# Patient Record
Sex: Female | Born: 1970 | Race: Black or African American | Hispanic: No | Marital: Married | Smoking: Former smoker
Health system: Southern US, Community
[De-identification: ages and names within clinical notes are randomized; demographics above are authoritative.]

## PROBLEM LIST (undated history)

## (undated) DIAGNOSIS — I10 Essential (primary) hypertension: Secondary | ICD-10-CM

## (undated) HISTORY — PX: CHOLECYSTECTOMY: SHX55

## (undated) HISTORY — PX: THYROIDECTOMY: SHX17

## (undated) HISTORY — PX: THYROID SURGERY: SHX805

---

## 2012-06-22 ENCOUNTER — Ambulatory Visit: Payer: Self-pay | Admitting: Internal Medicine

## 2013-06-26 ENCOUNTER — Ambulatory Visit: Payer: Self-pay | Admitting: Internal Medicine

## 2014-07-02 ENCOUNTER — Ambulatory Visit: Payer: Self-pay | Admitting: Obstetrics and Gynecology

## 2014-09-02 ENCOUNTER — Ambulatory Visit: Payer: Self-pay | Admitting: Surgery

## 2014-09-02 LAB — BASIC METABOLIC PANEL
Anion Gap: 2 — ABNORMAL LOW (ref 7–16)
BUN: 12 mg/dL (ref 7–18)
CREATININE: 0.67 mg/dL (ref 0.60–1.30)
Calcium, Total: 8.4 mg/dL — ABNORMAL LOW (ref 8.5–10.1)
Chloride: 110 mmol/L — ABNORMAL HIGH (ref 98–107)
Co2: 27 mmol/L (ref 21–32)
EGFR (African American): >60
EGFR (Non-African Amer.): >60
GLUCOSE: 85 mg/dL (ref 65–99)
Osmolality: 277 (ref 275–301)
Potassium: 3.8 mmol/L (ref 3.5–5.1)
Sodium: 139 mmol/L (ref 136–145)

## 2014-09-02 LAB — HEPATIC FUNCTION PANEL A (ARMC)
ALK PHOS: 61 U/L
ALT: 15 U/L
Albumin: 3.3 g/dL — ABNORMAL LOW (ref 3.4–5.0)
Bilirubin, Direct: 0.1 mg/dL (ref 0.00–0.20)
Bilirubin,Total: 0.5 mg/dL (ref 0.2–1.0)
SGOT(AST): 19 U/L (ref 15–37)
Total Protein: 6.9 g/dL (ref 6.4–8.2)

## 2014-09-02 LAB — CBC WITH DIFFERENTIAL/PLATELET
BASOS PCT: 1.4 %
Basophil #: 0.1 10*3/uL (ref 0.0–0.1)
EOS ABS: 0.1 10*3/uL (ref 0.0–0.7)
Eosinophil %: 1.2 %
HCT: 40.1 % (ref 35.0–47.0)
HGB: 12.5 g/dL (ref 12.0–16.0)
Lymphocyte #: 2.3 10*3/uL (ref 1.0–3.6)
Lymphocyte %: 38.4 %
MCH: 28.4 pg (ref 26.0–34.0)
MCHC: 31.2 g/dL — ABNORMAL LOW (ref 32.0–36.0)
MCV: 91 fL (ref 80–100)
Monocyte #: 0.4 x10 3/mm (ref 0.2–0.9)
Monocyte %: 7.1 %
Neutrophil #: 3.1 10*3/uL (ref 1.4–6.5)
Neutrophil %: 51.9 %
PLATELETS: 294 10*3/uL (ref 150–440)
RBC: 4.4 10*6/uL (ref 3.80–5.20)
RDW: 13.4 % (ref 11.5–14.5)
WBC: 6 10*3/uL (ref 3.6–11.0)

## 2014-09-02 LAB — PROTIME-INR
INR: 1
Prothrombin Time: 13.5 secs (ref 11.5–14.7)

## 2014-09-02 LAB — APTT: ACTIVATED PTT: 27.3 s (ref 23.6–35.9)

## 2014-09-09 ENCOUNTER — Ambulatory Visit: Payer: Self-pay | Admitting: Surgery

## 2014-09-10 LAB — ALBUMIN: Albumin: 3.1 g/dL — ABNORMAL LOW (ref 3.4–5.0)

## 2014-09-10 LAB — CALCIUM: CALCIUM: 8.3 mg/dL — AB (ref 8.5–10.1)

## 2014-09-11 LAB — PATHOLOGY REPORT

## 2014-09-20 ENCOUNTER — Ambulatory Visit: Payer: Self-pay | Admitting: Surgery

## 2014-09-20 LAB — URINALYSIS, COMPLETE
Bacteria: NONE SEEN
Bilirubin,UR: NEGATIVE
Blood: NEGATIVE
Glucose,UR: NEGATIVE mg/dL (ref 0–75)
KETONE: NEGATIVE
Leukocyte Esterase: NEGATIVE
Nitrite: NEGATIVE
PH: 6 (ref 4.5–8.0)
Protein: NEGATIVE
RBC,UR: 1 /HPF (ref 0–5)
Specific Gravity: 1.024 (ref 1.003–1.030)
Squamous Epithelial: 2
WBC UR: <1 /HPF (ref 0–5)

## 2014-09-22 LAB — URINE CULTURE

## 2015-03-29 NOTE — Op Note (Signed)
PATIENT NAME:  Brenda Chapman, Brenda Chapman MR#:  [id number] DATE OF BIRTH:  1971-06-22  DATE OF PROCEDURE:  09/09/2014  OPERATION PERFORMED: Total thyroidectomy.   PREOPERATIVE DIAGNOSIS: Follicular neoplasm.   POSTOPERATIVE DIAGNOSIS: Follicular neoplasm.  SURGEON: Claude MangesWilliam Chapman Halston Fairclough, MD   FIRST ASSISTANT: Hulda Marinimothy Oaks, MD   ANESTHESIA: General.   PROCEDURE IN DETAIL: The patient was placed supine on the operating room table and prepped and draped in the usual sterile fashion. A curvilinear incision was made in the lines of the neck 2 fingerbreadths above the suprasternal notch, and this was carried down through the skin, subcutaneous tissue and platysma muscle with electrocautery. The subplatysmal flaps are created superiorly and inferiorly, and the intermediate fascia of the neck was opened in the midline between the strap muscles and the strap muscles were dissected off the anterior thyroid lobes bilaterally. On each side, the thyroid was rotated medially and neither side had a middle thyroid vein. On each side, the superior pole vessels were ligated and divided with the Harmonic scalpel. The inferior pole vessels were ligated and divided with the Harmonic scalpel. The superior parathyroid glands were identified and were spared, and the area of the inferior parathyroid glands was never dissected. The recurrent laryngeal nerve was identified and was spared throughout the procedure, taking care not to use any energy devices near it. Hemostasis near the nerve was achieved with a combination of bipolar electrocautery, 3-0 silk ligatures, small Hemoclips and on the left a single 6-0 Prolene figure-of-eight suture ligature. The thyroid attachments to the trachea were divided with the electrocautery allowing me to remove it completely. On the right side, the primary thyroid nodule was extremely close the recurrent laryngeal nerve along a distance of approximately 2 cm and very tedious dissection was undertaken in  order not to bother the nerve. Small pieces of topical thrombin-soaked Gelfoam were placed where the nerve pierced the cricothyroid muscle bilaterally, and both of these were removed prior to closure. A TLS drain was placed on both sides of the neck and brought out through the suprasternal notch and the intermediate fascia was closed in the midline with a running 3-0 Monocryl suture. Running 3-0 Monocryl was then used to close the platysma and the skin was reapproximated with a running subcuticular 5-0 Monocryl and suture strips. The patient tolerated the procedure well. There were no complications.    ____________________________ Claude MangesWilliam Chapman. Sabina Beavers, MD wfm:TT D: 09/09/2014 13:42:50 ET T: 09/09/2014 15:48:35 ET JOB#: [id number]  cc: Claude MangesWilliam Chapman. Mariam Helbert, MD, <Dictator> Claude MangesWILLIAM Chapman Roderica Cathell MD ELECTRONICALLY SIGNED 09/09/2014 16:28

## 2016-09-26 ENCOUNTER — Emergency Department: Payer: 59

## 2016-09-26 ENCOUNTER — Encounter: Payer: Self-pay | Admitting: Emergency Medicine

## 2016-09-26 ENCOUNTER — Emergency Department
Admission: EM | Admit: 2016-09-26 | Discharge: 2016-09-26 | Disposition: A | Discharge status: Home / Self Care | Payer: 59 | Attending: Student in an Organized Health Care Education/Training Program | Admitting: Student in an Organized Health Care Education/Training Program

## 2016-09-26 DIAGNOSIS — Z87891 Personal history of nicotine dependence: Secondary | ICD-10-CM | POA: Diagnosis not present

## 2016-09-26 DIAGNOSIS — Y9389 Activity, other specified: Secondary | ICD-10-CM | POA: Diagnosis not present

## 2016-09-26 DIAGNOSIS — R102 Pelvic and perineal pain: Secondary | ICD-10-CM | POA: Diagnosis not present

## 2016-09-26 DIAGNOSIS — R1030 Lower abdominal pain, unspecified: Secondary | ICD-10-CM | POA: Diagnosis not present

## 2016-09-26 DIAGNOSIS — Y92410 Unspecified street and highway as the place of occurrence of the external cause: Secondary | ICD-10-CM | POA: Diagnosis not present

## 2016-09-26 DIAGNOSIS — Y999 Unspecified external cause status: Secondary | ICD-10-CM | POA: Diagnosis not present

## 2016-09-26 DIAGNOSIS — S93121A Dislocation of metatarsophalangeal joint of right great toe, initial encounter: Secondary | ICD-10-CM | POA: Diagnosis not present

## 2016-09-26 DIAGNOSIS — S99921A Unspecified injury of right foot, initial encounter: Secondary | ICD-10-CM | POA: Diagnosis present

## 2016-09-26 DIAGNOSIS — S93104A Unspecified dislocation of right toe(s), initial encounter: Secondary | ICD-10-CM

## 2016-09-26 DIAGNOSIS — R0789 Other chest pain: Secondary | ICD-10-CM

## 2016-09-26 LAB — CBC WITH DIFFERENTIAL/PLATELET
BASOS ABS: 0.1 10*3/uL (ref 0–0.1)
BASOS PCT: 1 %
Eosinophils Absolute: 0.1 10*3/uL (ref 0–0.7)
Eosinophils Relative: 0 %
HEMATOCRIT: 40.8 % (ref 35.0–47.0)
HEMOGLOBIN: 13.4 g/dL (ref 12.0–16.0)
LYMPHS ABS: 2 10*3/uL (ref 1.0–3.6)
LYMPHS PCT: 12 %
MCH: 29.4 pg (ref 26.0–34.0)
MCHC: 32.7 g/dL (ref 32.0–36.0)
MCV: 89.9 fL (ref 80.0–100.0)
MONOS PCT: 6 %
Monocytes Absolute: 1 10*3/uL — ABNORMAL HIGH (ref 0.2–0.9)
Neutro Abs: 13.5 10*3/uL — ABNORMAL HIGH (ref 1.4–6.5)
Neutrophils Relative %: 81 %
Platelets: 311 10*3/uL (ref 150–440)
RBC: 4.54 MIL/uL (ref 3.80–5.20)
RDW: 13.3 % (ref 11.5–14.5)
WBC: 16.6 10*3/uL — ABNORMAL HIGH (ref 3.6–11.0)

## 2016-09-26 LAB — COMPREHENSIVE METABOLIC PANEL
ALBUMIN: 4 g/dL (ref 3.5–5.0)
ALK PHOS: 57 U/L (ref 38–126)
ALT: 18 U/L (ref 14–54)
AST: 29 U/L (ref 15–41)
Anion gap: 6 (ref 5–15)
BILIRUBIN TOTAL: 0.9 mg/dL (ref 0.3–1.2)
BUN: 11 mg/dL (ref 6–20)
CALCIUM: 9 mg/dL (ref 8.9–10.3)
CO2: 27 mmol/L (ref 22–32)
Chloride: 106 mmol/L (ref 101–111)
Creatinine, Ser: 0.7 mg/dL (ref 0.44–1.00)
GFR calc Af Amer: >60 mL/min (ref >60)
GFR calc non Af Amer: >60 mL/min (ref >60)
GLUCOSE: 97 mg/dL (ref 65–99)
Potassium: 3.5 mmol/L (ref 3.5–5.1)
Sodium: 139 mmol/L (ref 135–145)
TOTAL PROTEIN: 7.9 g/dL (ref 6.5–8.1)

## 2016-09-26 LAB — HCG, QUANTITATIVE, PREGNANCY: hCG, Beta Chain, Quant, S: <1 m[IU]/mL (ref <5)

## 2016-09-26 MED ORDER — ACETAMINOPHEN 325 MG PO TABS
650.0000 mg | ORAL_TABLET | Freq: Once | ORAL | Status: AC
  Administered 2016-09-26: 650 mg via ORAL
  Filled 2016-09-26: qty 2

## 2016-09-26 MED ORDER — ONDANSETRON HCL 4 MG/2ML IJ SOLN
4.0000 mg | Freq: Once | INTRAMUSCULAR | Status: AC
  Administered 2016-09-26: 4 mg via INTRAVENOUS
  Filled 2016-09-26: qty 2

## 2016-09-26 MED ORDER — NAPROXEN 500 MG PO TABS: 500.0000 mg | ORAL_TABLET | Freq: Two times a day (BID) | ORAL | 0 refills | Status: AC

## 2016-09-26 MED ORDER — BUPIVACAINE HCL (PF) 0.5 % IJ SOLN
30.0000 mL | Freq: Once | INTRAMUSCULAR | Status: DC
  Filled 2016-09-26: qty 30

## 2016-09-26 MED ORDER — IOPAMIDOL (ISOVUE-300) INJECTION 61%
125.0000 mL | Freq: Once | INTRAVENOUS | Status: AC | PRN
  Administered 2016-09-26: 125 mL via INTRAVENOUS

## 2016-09-26 MED ORDER — HYDROCODONE-ACETAMINOPHEN 5-325 MG PO TABS: 1.0000 | ORAL_TABLET | ORAL | 0 refills | Status: AC | PRN

## 2016-09-26 MED ORDER — FENTANYL CITRATE (PF) 100 MCG/2ML IJ SOLN
100.0000 ug | INTRAMUSCULAR | Status: DC | PRN
  Administered 2016-09-26: 100 ug via INTRAVENOUS
  Filled 2016-09-26: qty 2

## 2016-09-26 MED ORDER — CYCLOBENZAPRINE HCL 10 MG PO TABS: 10.0000 mg | ORAL_TABLET | Freq: Three times a day (TID) | ORAL | 0 refills | Status: AC | PRN
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
## 2017-06-22 ENCOUNTER — Other Ambulatory Visit: Payer: Self-pay | Admitting: Internal Medicine

## 2017-06-22 DIAGNOSIS — R600 Localized edema: Secondary | ICD-10-CM | POA: Diagnosis not present

## 2017-06-22 DIAGNOSIS — Z1231 Encounter for screening mammogram for malignant neoplasm of breast: Secondary | ICD-10-CM

## 2017-06-22 DIAGNOSIS — Z23 Encounter for immunization: Secondary | ICD-10-CM | POA: Diagnosis not present

## 2017-06-22 DIAGNOSIS — E890 Postprocedural hypothyroidism: Secondary | ICD-10-CM | POA: Diagnosis not present

## 2017-06-22 DIAGNOSIS — Z0000 Encounter for general adult medical examination without abnormal findings: Secondary | ICD-10-CM | POA: Diagnosis not present

## 2017-06-22 DIAGNOSIS — I10 Essential (primary) hypertension: Secondary | ICD-10-CM | POA: Diagnosis not present

## 2017-07-07 ENCOUNTER — Ambulatory Visit
Admission: RE | Admit: 2017-07-07 | Discharge: 2017-07-07 | Disposition: A | Discharge status: Home / Self Care | Payer: 59 | Source: Ambulatory Visit | Attending: Internal Medicine | Admitting: Internal Medicine

## 2017-07-07 DIAGNOSIS — Z1231 Encounter for screening mammogram for malignant neoplasm of breast: Secondary | ICD-10-CM

## 2017-08-06 ENCOUNTER — Emergency Department: Payer: Commercial Managed Care - HMO

## 2017-08-06 ENCOUNTER — Emergency Department
Admission: EM | Admit: 2017-08-06 | Discharge: 2017-08-06 | Disposition: A | Discharge status: Home / Self Care | Payer: Commercial Managed Care - HMO | Attending: Emergency Medicine | Admitting: Emergency Medicine

## 2017-08-06 ENCOUNTER — Encounter: Payer: Self-pay | Admitting: Emergency Medicine

## 2017-08-06 DIAGNOSIS — R0789 Other chest pain: Secondary | ICD-10-CM | POA: Diagnosis not present

## 2017-08-06 DIAGNOSIS — Z87891 Personal history of nicotine dependence: Secondary | ICD-10-CM | POA: Insufficient documentation

## 2017-08-06 DIAGNOSIS — Z79899 Other long term (current) drug therapy: Secondary | ICD-10-CM | POA: Diagnosis not present

## 2017-08-06 DIAGNOSIS — R079 Chest pain, unspecified: Secondary | ICD-10-CM | POA: Insufficient documentation

## 2017-08-06 HISTORY — DX: Essential (primary) hypertension: I10

## 2017-08-06 LAB — BASIC METABOLIC PANEL
Anion gap: 7 (ref 5–15)
BUN: 12 mg/dL (ref 6–20)
CO2: 27 mmol/L (ref 22–32)
CREATININE: 0.66 mg/dL (ref 0.44–1.00)
Calcium: 8.4 mg/dL — ABNORMAL LOW (ref 8.9–10.3)
Chloride: 104 mmol/L (ref 101–111)
GFR calc non Af Amer: >60 mL/min (ref >60)
Glucose, Bld: 90 mg/dL (ref 65–99)
Potassium: 3.1 mmol/L — ABNORMAL LOW (ref 3.5–5.1)
SODIUM: 138 mmol/L (ref 135–145)

## 2017-08-06 LAB — CBC
HCT: 35.5 % (ref 35.0–47.0)
Hemoglobin: 11.9 g/dL — ABNORMAL LOW (ref 12.0–16.0)
MCH: 29 pg (ref 26.0–34.0)
MCHC: 33.5 g/dL (ref 32.0–36.0)
MCV: 86.7 fL (ref 80.0–100.0)
Platelets: 308 10*3/uL (ref 150–440)
RBC: 4.09 MIL/uL (ref 3.80–5.20)
RDW: 13.4 % (ref 11.5–14.5)
WBC: 9.2 10*3/uL (ref 3.6–11.0)

## 2017-08-06 LAB — TROPONIN I: Troponin I: <0.03 ng/mL (ref <0.03)

## 2017-08-06 LAB — POCT PREGNANCY, URINE: PREG TEST UR: NEGATIVE

## 2017-08-06 MED ORDER — ONDANSETRON 4 MG PO TBDP
ORAL_TABLET | ORAL | Status: AC
  Filled 2017-08-06: qty 1

## 2017-08-06 MED ORDER — IBUPROFEN 600 MG PO TABS: 600.0000 mg | ORAL_TABLET | Freq: Three times a day (TID) | ORAL | 0 refills | Status: AC | PRN

## 2017-08-06 MED ORDER — ONDANSETRON 4 MG PO TBDP
4.0000 mg | ORAL_TABLET | Freq: Once | ORAL | Status: AC
  Administered 2017-08-06: 4 mg via ORAL

## 2017-08-06 MED ORDER — OXYCODONE-ACETAMINOPHEN 5-325 MG PO TABS
2.0000 | ORAL_TABLET | Freq: Once | ORAL | Status: AC
  Administered 2017-08-06: 2 via ORAL
  Filled 2017-08-06: qty 2

## 2017-08-06 MED ORDER — CYCLOBENZAPRINE HCL 10 MG PO TABS: 10.0000 mg | ORAL_TABLET | Freq: Three times a day (TID) | ORAL | 0 refills | Status: AC | PRN

## 2017-08-06 NOTE — ED Provider Notes (Signed)
[facility] Emergency Department Provider Note       Time seen: ----------------------------------------- 10:25 AM on 08/06/2017 -----------------------------------------     I have reviewed the triage vital signs and the nursing notes.   HISTORY   Chief Complaint Chest Pain    HPI Brenda Chapman is a 46 y.o. female who presents to the ED for chest pain that started last night. Patient is now having chest discomfort today. Pain is 7 out of 10 located in the mid to right chest, described as an ache. She has never had this happen before, nothing makes it better or worse.   Past Medical History:  Diagnosis Date  . Hypertension     There are no active problems to display for this patient.   Past Surgical History:  Procedure Laterality Date  . CHOLECYSTECTOMY    . THYROID SURGERY    . THYROIDECTOMY      Allergies Erythromycin  Social History Social History  Substance Use Topics  . Smoking status: Former Games developer  . Smokeless tobacco: Never Used  . Alcohol use Yes    Review of Systems Constitutional: Negative for fever. Cardiovascular: Positive for chest pain Respiratory: Negative for shortness of breath. Gastrointestinal: Negative for abdominal pain, vomiting and diarrhea. Genitourinary: Negative for dysuria. Musculoskeletal: Negative for back pain. Skin: Negative for rash. Neurological: Negative for headaches, focal weakness or numbness.  All systems negative/normal/unremarkable except as stated in the HPI  ____________________________________________   PHYSICAL EXAM:  VITAL SIGNS: ED Triage Vitals  Enc Vitals Group     BP 08/06/17 1018 136/87     Pulse Rate 08/06/17 1018 74     Resp 08/06/17 1018 18     Temp 08/06/17 1018 98.6 F (37 C)     Temp Source 08/06/17 1018 Oral     SpO2 08/06/17 1018 98 %     Weight 08/06/17 1018 222 lb 4.8 oz (100.8 kg)     Height 08/06/17 1018 5\' 3"  (1.6 m)     Head Circumference --       Peak Flow --      Pain Score 08/06/17 1016 7     Pain Loc --      Pain Edu? --      Excl. in GC? --     Constitutional: Alert and oriented. Well appearing and in no distress. Eyes: Conjunctivae are normal. Normal extraocular movements. ENT   Head: Normocephalic and atraumatic.   Nose: No congestion/rhinnorhea.   Mouth/Throat: Mucous membranes are moist.   Neck: No stridor. Cardiovascular: Normal rate, regular rhythm. No murmurs, rubs, or gallops. Respiratory: Normal respiratory effort without tachypnea nor retractions. Breath sounds are clear and equal bilaterally. No wheezes/rales/rhonchi. Gastrointestinal: Soft and nontender. Normal bowel sounds Musculoskeletal: pain with range of motion of the right shoulder and right upper chest wall tenderness Neurologic:  Normal speech and language. No gross focal neurologic deficits are appreciated.  Skin:  Skin is warm, dry and intact. No rash noted. Psychiatric: Mood and affect are normal. Speech and behavior are normal.  ____________________________________________  EKG: Interpreted by me.sinus rhythm rate of 60 bpm, normal PR interval, normal QRS, normal QT. Nonspecific T-wave changes.  ____________________________________________  ED COURSE:  Pertinent labs & imaging results that were available during my care of the patient were reviewed by me and considered in my medical decision making (see chart for details). Patient presents for chest pain, we will assess with labs and imaging as indicated.   Procedures ____________________________________________  LABS (pertinent positives/negatives)  Labs Reviewed  BASIC METABOLIC PANEL - Abnormal; Notable for the following:       Result Value   Potassium 3.1 (*)    Calcium 8.4 (*)    All other components within normal limits  CBC - Abnormal; Notable for the following:    Hemoglobin 11.9 (*)    All other components within normal limits  TROPONIN I  POC URINE PREG, ED   POCT PREGNANCY, URINE    RADIOLOGY Images were viewed by me  Chest x-ray  IMPRESSION: No acute cardiopulmonary disease. Stable scarring in the left lower lobe. ____________________________________________  FINAL ASSESSMENT AND PLAN  Chest pain  Plan: Patient's labs and imaging were dictated above. Patient had presented for chest pain and right upper chest and shoulder pain that appears to be musculoskeletal in origin.There is certainly pain and reproducible tenderness on examination. Heart score is low risk for ACS and she'll be discharged with NSAIDs and Flexeril.   Emily FilbertWilliams, Jonathan E, MD   Note: This note was generated in part or whole with voice recognition software. Voice recognition is usually quite accurate but there are transcription errors that can and very often do occur. I apologize for any typographical errors that were not detected and corrected.     Emily FilbertWilliams, Jonathan E, MD 08/06/17 1150

## 2017-08-06 NOTE — ED Triage Notes (Signed)
Patient to ER for c/o chest pain that started last night. Now having "chest discomfort" today. Patient ambulatory to triage without difficulty.

## 2017-08-10 DIAGNOSIS — I10 Essential (primary) hypertension: Secondary | ICD-10-CM | POA: Diagnosis not present

## 2017-08-10 DIAGNOSIS — E890 Postprocedural hypothyroidism: Secondary | ICD-10-CM | POA: Diagnosis not present

## 2017-08-10 DIAGNOSIS — M940 Chondrocostal junction syndrome [Tietze]: Secondary | ICD-10-CM | POA: Diagnosis not present

## 2017-08-23 DIAGNOSIS — T502X5A Adverse effect of carbonic-anhydrase inhibitors, benzothiadiazides and other diuretics, initial encounter: Secondary | ICD-10-CM | POA: Diagnosis not present

## 2017-08-23 DIAGNOSIS — E876 Hypokalemia: Secondary | ICD-10-CM | POA: Diagnosis not present

## 2017-08-23 DIAGNOSIS — Z23 Encounter for immunization: Secondary | ICD-10-CM | POA: Diagnosis not present

## 2017-08-23 DIAGNOSIS — E890 Postprocedural hypothyroidism: Secondary | ICD-10-CM | POA: Diagnosis not present

## 2017-08-23 DIAGNOSIS — I10 Essential (primary) hypertension: Secondary | ICD-10-CM | POA: Diagnosis not present

## 2017-09-20 DIAGNOSIS — H1012 Acute atopic conjunctivitis, left eye: Secondary | ICD-10-CM | POA: Diagnosis not present

## 2017-10-26 DIAGNOSIS — E890 Postprocedural hypothyroidism: Secondary | ICD-10-CM | POA: Diagnosis not present

## 2017-12-14 DIAGNOSIS — Z719 Counseling, unspecified: Secondary | ICD-10-CM | POA: Diagnosis not present

## 2017-12-21 DIAGNOSIS — Z719 Counseling, unspecified: Secondary | ICD-10-CM | POA: Diagnosis not present

## 2017-12-28 DIAGNOSIS — Z719 Counseling, unspecified: Secondary | ICD-10-CM | POA: Diagnosis not present

## 2018-01-04 DIAGNOSIS — Z719 Counseling, unspecified: Secondary | ICD-10-CM | POA: Diagnosis not present

## 2018-01-11 DIAGNOSIS — Z719 Counseling, unspecified: Secondary | ICD-10-CM | POA: Diagnosis not present

## 2018-02-01 IMAGING — CR DG CHEST 2V
2 series · 2 of 2 positions shown · non-contrast
Comparison: CT chest 09/26/2016.

CLINICAL DATA: Acute onset of chest pain that began last night.
Current history of hypertension. Former smoker.

EXAM:
CHEST  2 VIEW

[chest pa]
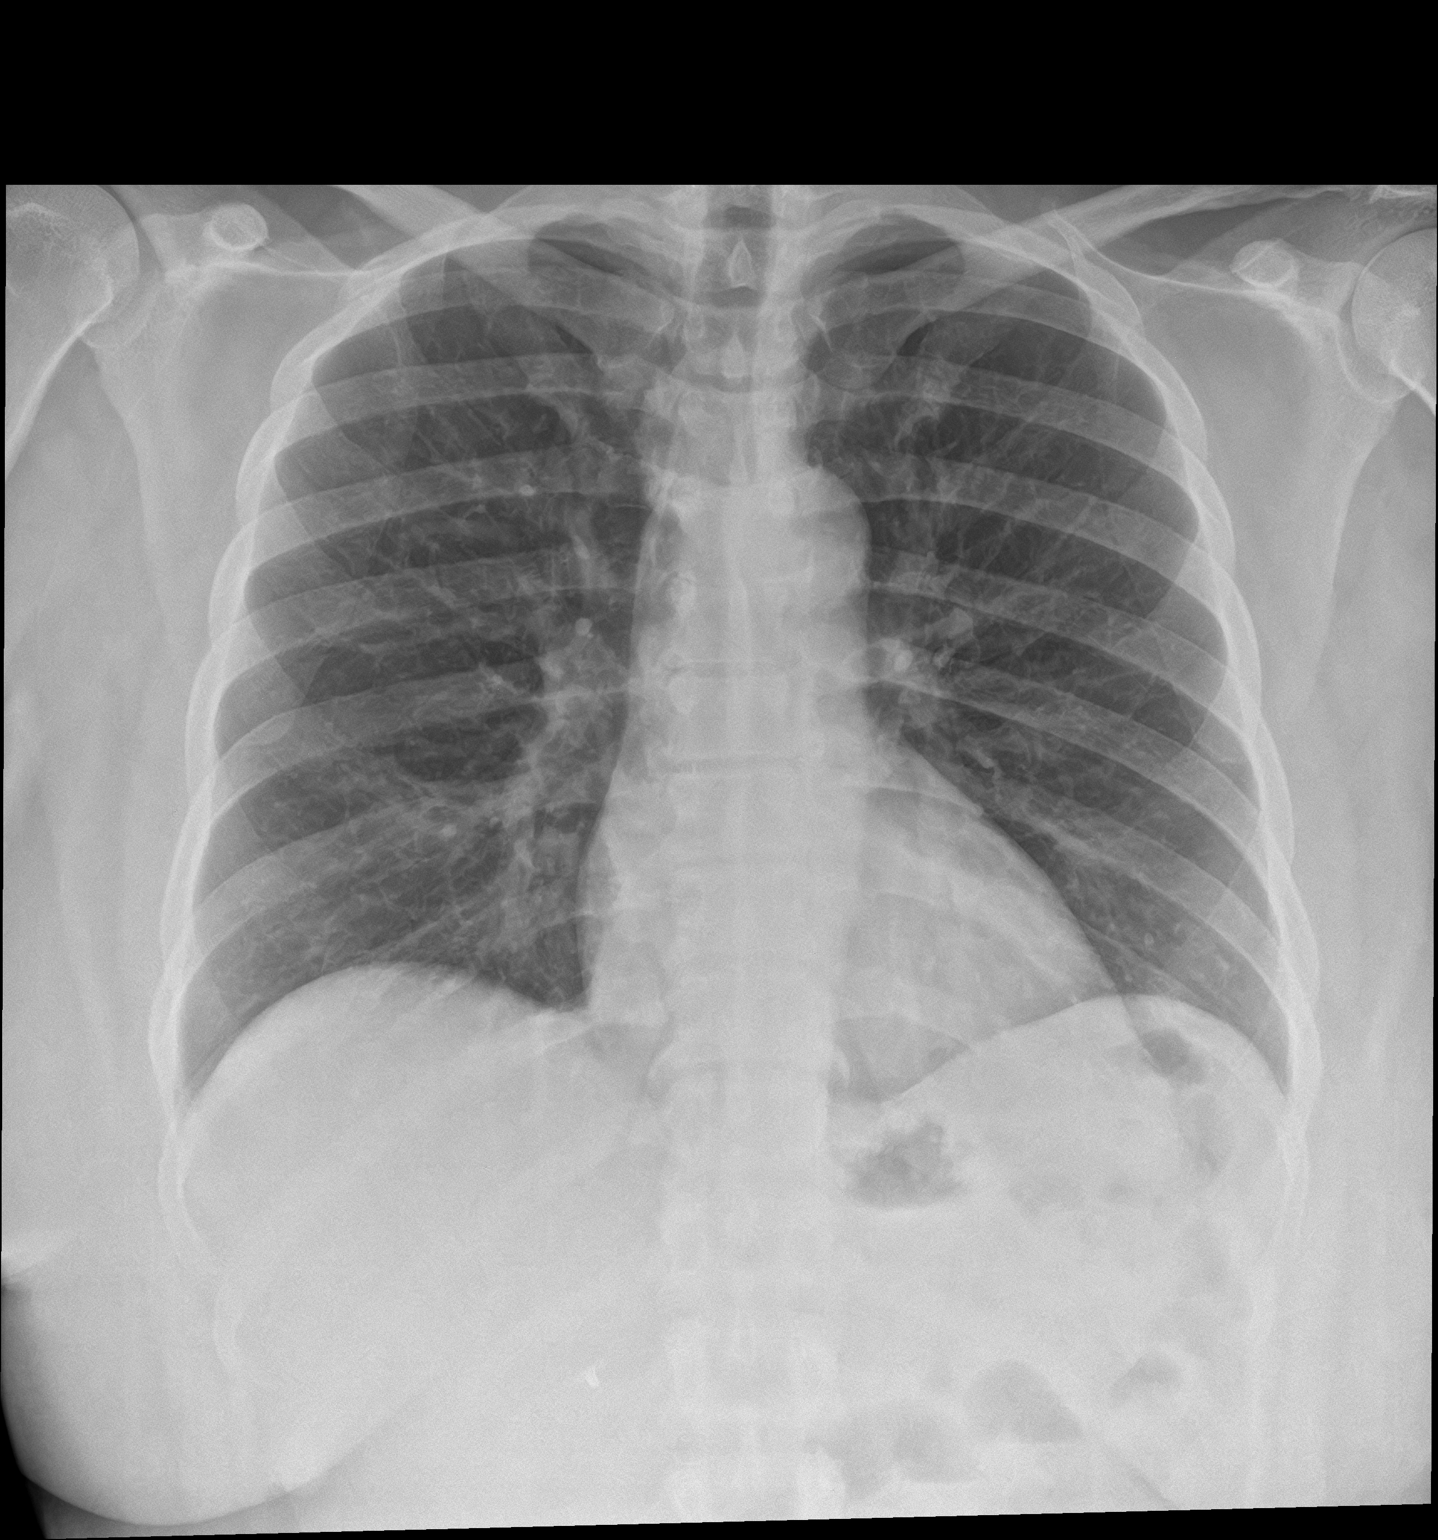

[chest lat]
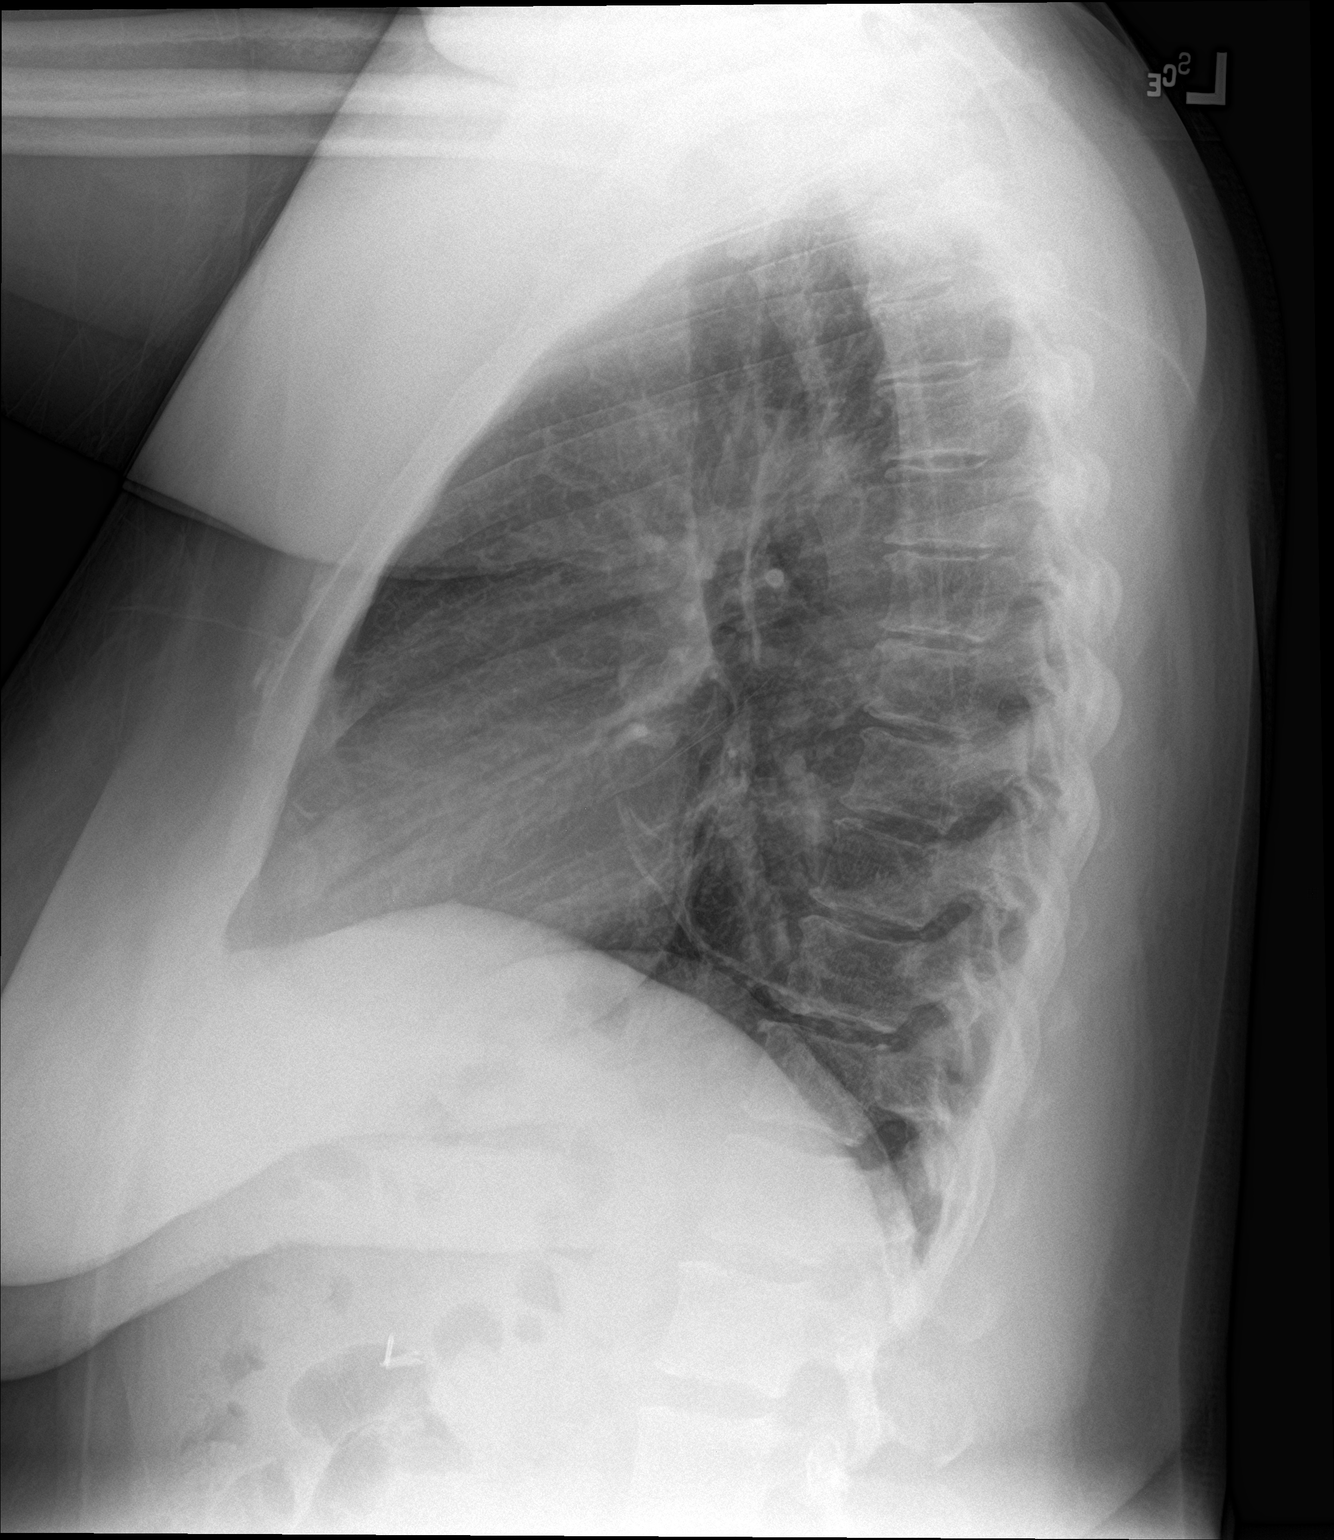

[2 of 2 positions shown; findings below may reference images not displayed]

FINDINGS: Cardiomediastinal silhouette unremarkable, unchanged. Linear
scarring in the left lower lobe, unchanged since the prior CT. Lungs
otherwise clear. Bronchovascular markings normal. No localized
airspace consolidation. No pleural effusions. No pneumothorax.
Normal pulmonary vascularity. Mild degenerative changes involving
the thoracic spine.
IMPRESSION: No acute cardiopulmonary disease. Stable scarring in the left lower
lobe.

## 2018-06-22 DIAGNOSIS — E890 Postprocedural hypothyroidism: Secondary | ICD-10-CM | POA: Diagnosis not present

## 2018-06-23 DIAGNOSIS — E890 Postprocedural hypothyroidism: Secondary | ICD-10-CM | POA: Diagnosis not present

## 2018-06-26 DIAGNOSIS — Z0000 Encounter for general adult medical examination without abnormal findings: Secondary | ICD-10-CM | POA: Diagnosis not present

## 2018-07-06 ENCOUNTER — Other Ambulatory Visit: Payer: Self-pay | Admitting: Internal Medicine

## 2018-07-06 DIAGNOSIS — Z1231 Encounter for screening mammogram for malignant neoplasm of breast: Secondary | ICD-10-CM

## 2018-07-18 ENCOUNTER — Ambulatory Visit: Payer: 59

## 2018-07-27 ENCOUNTER — Encounter (INDEPENDENT_AMBULATORY_CARE_PROVIDER_SITE_OTHER): Payer: Self-pay

## 2018-07-27 ENCOUNTER — Ambulatory Visit
Admission: RE | Admit: 2018-07-27 | Discharge: 2018-07-27 | Disposition: A | Discharge status: Home / Self Care | Payer: 59 | Source: Ambulatory Visit | Attending: Internal Medicine | Admitting: Internal Medicine

## 2018-07-27 DIAGNOSIS — Z1231 Encounter for screening mammogram for malignant neoplasm of breast: Secondary | ICD-10-CM | POA: Diagnosis present

## 2018-09-17 DIAGNOSIS — Z23 Encounter for immunization: Secondary | ICD-10-CM | POA: Diagnosis not present

## 2019-01-22 IMAGING — MG MM DIGITAL SCREENING BILAT W/ TOMO W/ CAD
8 series · 8 of 24 positions shown · non-contrast
Comparison: Previous exam(s).

CLINICAL DATA: Screening.

EXAM:
DIGITAL SCREENING BILATERAL MAMMOGRAM WITH TOMO AND CAD

[L CC synth-2D]
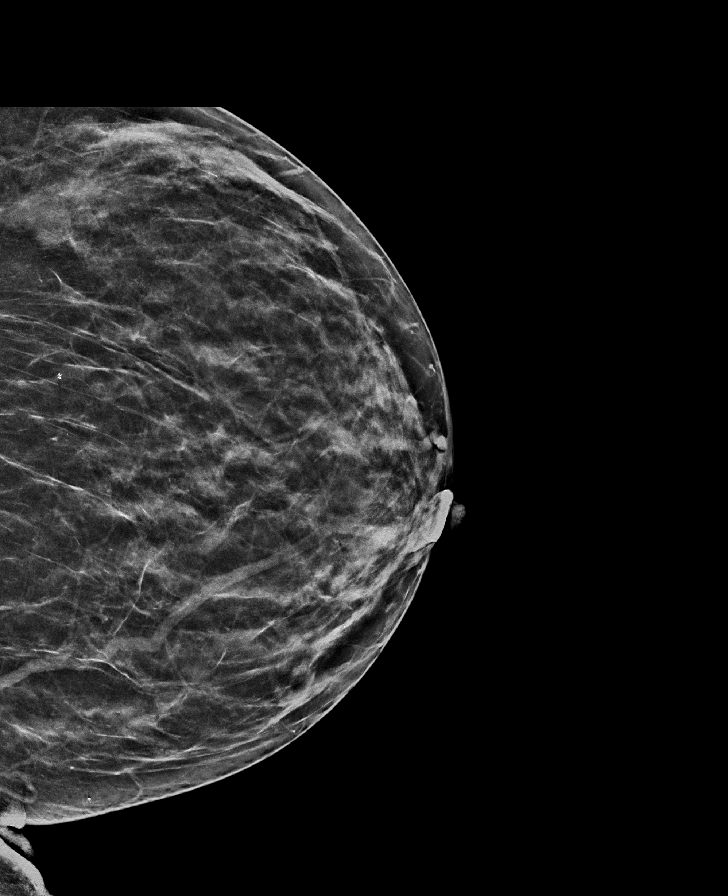

[R MLO synth-2D]
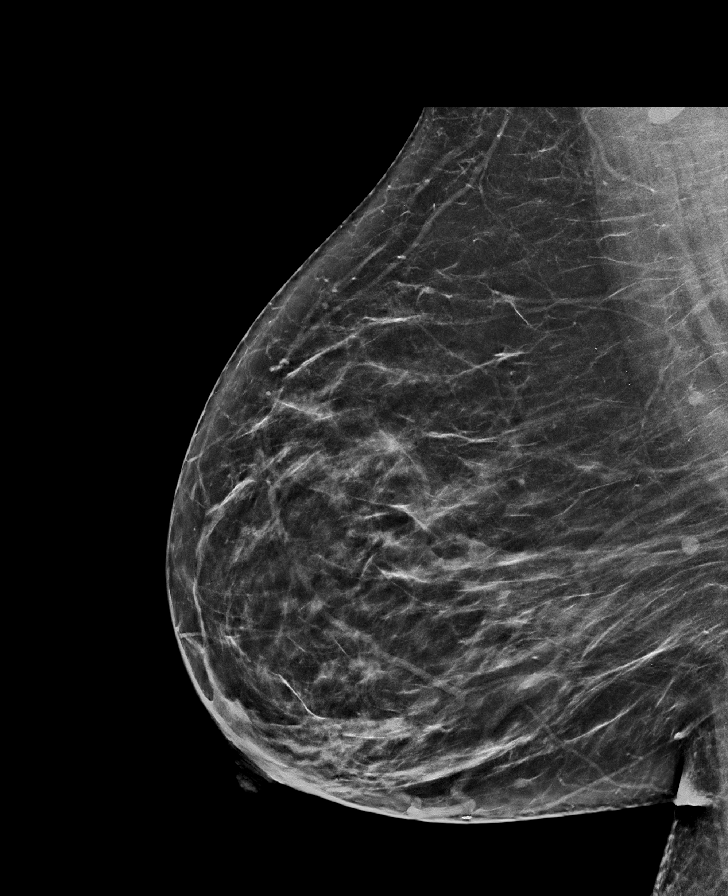

[L MLO synth-2D]
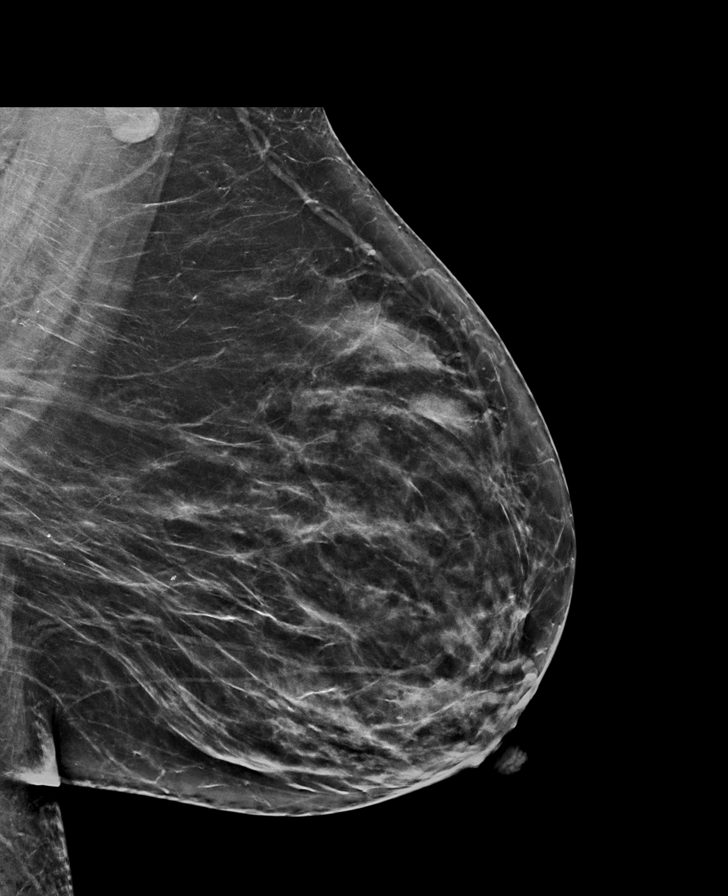

[R CC synth-2D]
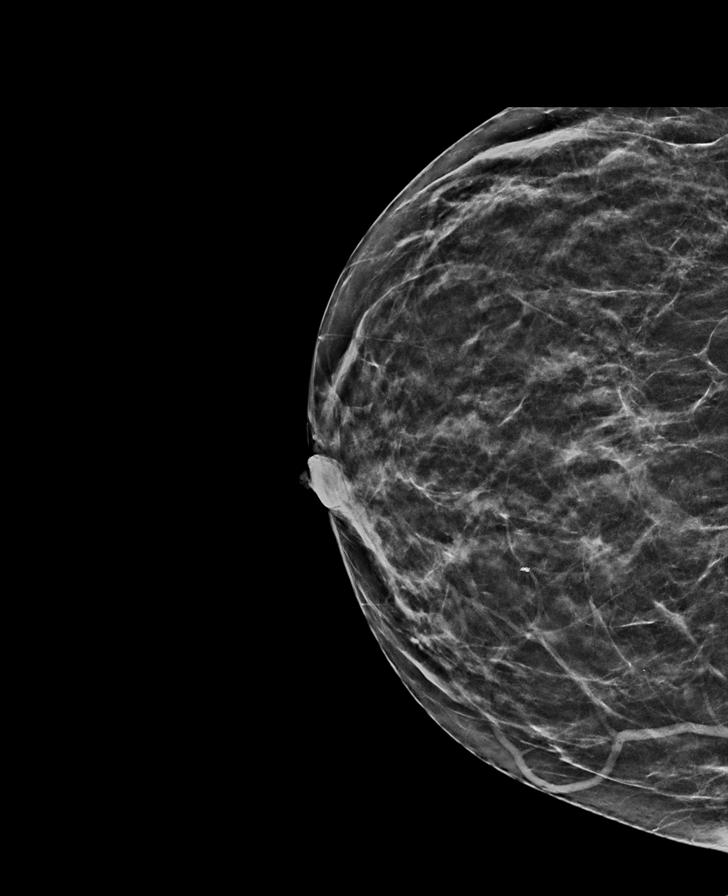

[L CC tomo · tomo slice 30/59.0]
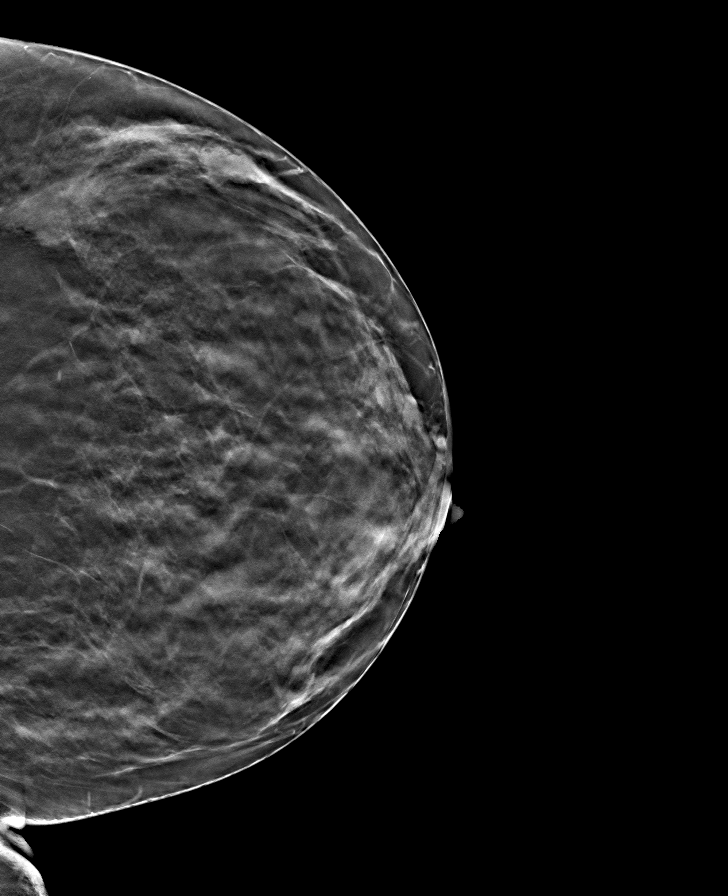

[R CC tomo · tomo slice 27/53.0]
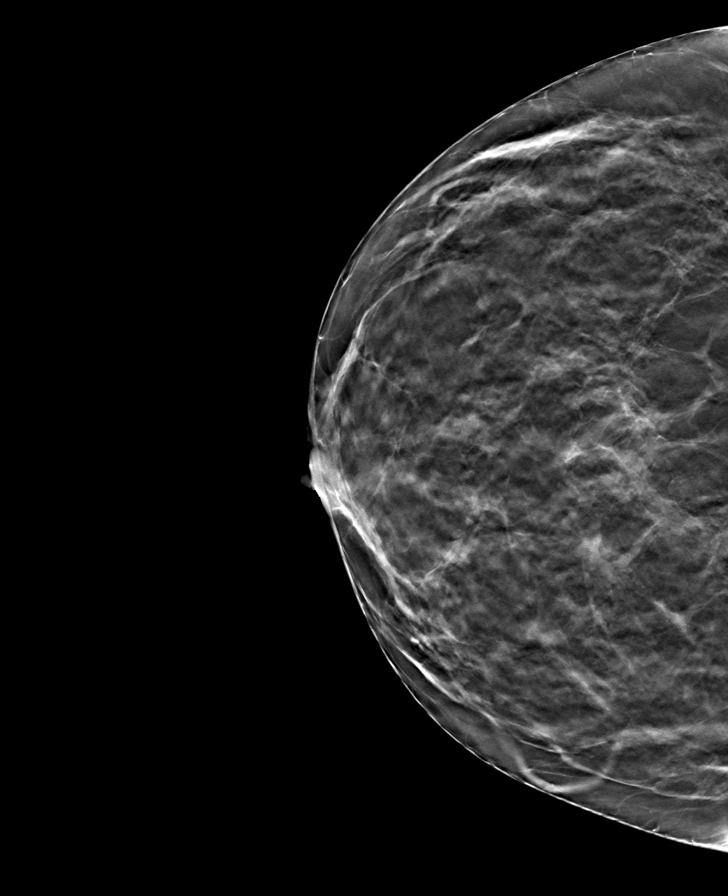

[R MLO tomo · tomo slice 37/74.0]
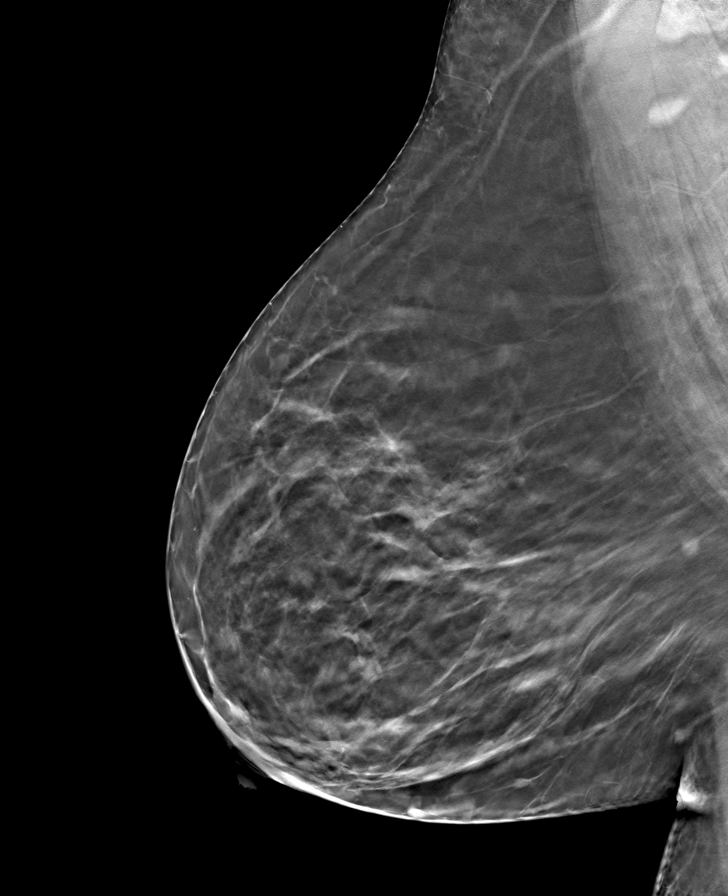

[L MLO tomo · tomo slice 39/78.0]
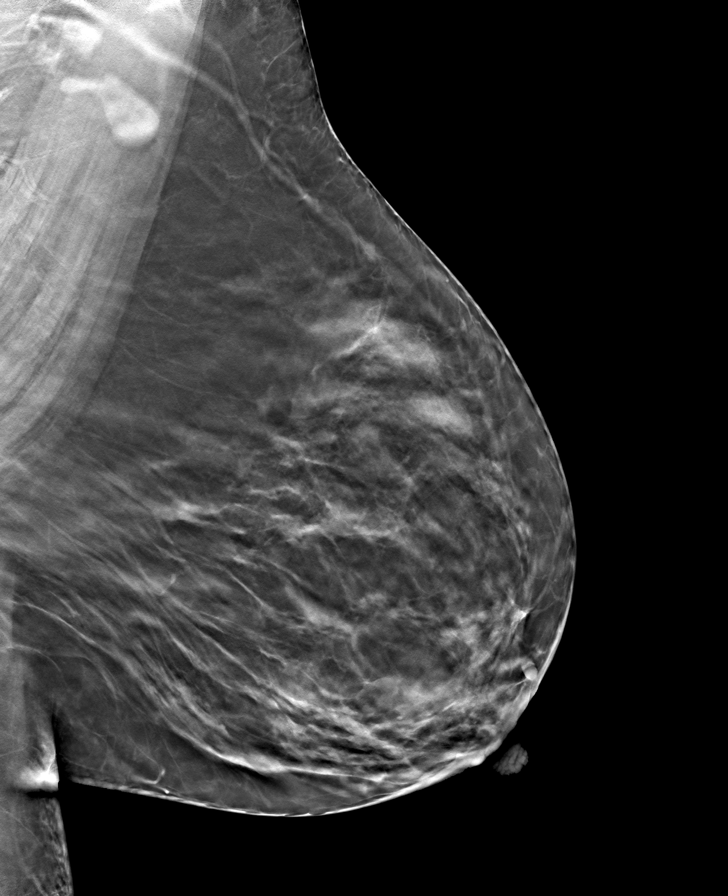

[8 of 24 positions shown; findings below may reference images not displayed]

ACR Breast Density Category c: The breast tissue is heterogeneously
dense, which may obscure small masses.
FINDINGS: There are no findings suspicious for malignancy. Images were
processed with CAD.
IMPRESSION: No mammographic evidence of malignancy. A result letter of this
screening mammogram will be mailed directly to the patient.

RECOMMENDATION:
Screening mammogram in one year. (Code:FT-U-LHB)

BI-RADS CATEGORY  1: Negative.

## 2019-07-05 ENCOUNTER — Other Ambulatory Visit: Payer: Self-pay | Admitting: Internal Medicine

## 2019-07-05 DIAGNOSIS — Z1231 Encounter for screening mammogram for malignant neoplasm of breast: Secondary | ICD-10-CM

## 2019-07-24 ENCOUNTER — Ambulatory Visit: Admission: RE | Admit: 2019-07-24 | Payer: 59 | Source: Ambulatory Visit

## 2019-07-31 ENCOUNTER — Other Ambulatory Visit: Payer: Self-pay

## 2019-07-31 ENCOUNTER — Ambulatory Visit
Admission: RE | Admit: 2019-07-31 | Discharge: 2019-07-31 | Disposition: A | Discharge status: Home / Self Care | Payer: 59 | Source: Ambulatory Visit | Attending: Internal Medicine | Admitting: Internal Medicine

## 2019-07-31 DIAGNOSIS — Z1231 Encounter for screening mammogram for malignant neoplasm of breast: Secondary | ICD-10-CM | POA: Diagnosis present

## 2019-08-08 ENCOUNTER — Ambulatory Visit: Payer: 59

## 2020-07-17 ENCOUNTER — Other Ambulatory Visit: Payer: Self-pay | Admitting: Internal Medicine

## 2020-07-17 DIAGNOSIS — Z1231 Encounter for screening mammogram for malignant neoplasm of breast: Secondary | ICD-10-CM

## 2020-08-04 ENCOUNTER — Other Ambulatory Visit: Payer: Self-pay

## 2020-08-04 ENCOUNTER — Ambulatory Visit
Admission: RE | Admit: 2020-08-04 | Discharge: 2020-08-04 | Disposition: A | Discharge status: Home / Self Care | Payer: 59 | Source: Ambulatory Visit | Attending: Internal Medicine | Admitting: Internal Medicine

## 2020-08-04 DIAGNOSIS — Z1231 Encounter for screening mammogram for malignant neoplasm of breast: Secondary | ICD-10-CM | POA: Insufficient documentation

## 2020-08-07 ENCOUNTER — Other Ambulatory Visit: Payer: Self-pay | Admitting: Internal Medicine

## 2020-08-07 DIAGNOSIS — R928 Other abnormal and inconclusive findings on diagnostic imaging of breast: Secondary | ICD-10-CM

## 2020-08-07 DIAGNOSIS — N6320 Unspecified lump in the left breast, unspecified quadrant: Secondary | ICD-10-CM

## 2020-08-13 ENCOUNTER — Ambulatory Visit
Admission: RE | Admit: 2020-08-13 | Discharge: 2020-08-13 | Disposition: A | Discharge status: Home / Self Care | Payer: 59 | Source: Ambulatory Visit | Attending: Internal Medicine | Admitting: Internal Medicine

## 2020-08-13 DIAGNOSIS — R928 Other abnormal and inconclusive findings on diagnostic imaging of breast: Secondary | ICD-10-CM | POA: Diagnosis present

## 2020-08-13 DIAGNOSIS — N6320 Unspecified lump in the left breast, unspecified quadrant: Secondary | ICD-10-CM

## 2022-08-12 ENCOUNTER — Other Ambulatory Visit: Payer: Self-pay | Admitting: Internal Medicine

## 2022-08-12 DIAGNOSIS — Z1231 Encounter for screening mammogram for malignant neoplasm of breast: Secondary | ICD-10-CM

## 2022-08-31 ENCOUNTER — Ambulatory Visit
Admission: RE | Admit: 2022-08-31 | Discharge: 2022-08-31 | Disposition: A | Discharge status: Home / Self Care | Payer: 59 | Source: Ambulatory Visit | Attending: Internal Medicine | Admitting: Internal Medicine

## 2022-08-31 DIAGNOSIS — Z1231 Encounter for screening mammogram for malignant neoplasm of breast: Secondary | ICD-10-CM | POA: Insufficient documentation

## 2023-02-10 ENCOUNTER — Encounter: Payer: Self-pay | Admitting: Gastroenterology

## 2023-02-10 NOTE — H&P (Signed)
Pre-Procedure H&P   Patient ID: Brenda Chapman is a 52 y.o. female.  Gastroenterology Provider: Annamaria Helling, DO  Referring Provider: Laurine Blazer, PA PCP: Rusty Aus, MD  Date: 02/11/2023  HPI Brenda Chapman is a 52 y.o. female who presents today for Colonoscopy for Anemia .  Patient with a history of anemia after an episode of heavy menses.  She denies any melena or hematochezia during this time.  Hemoglobin went from 13 in August 2022 down to 9.7 with MCV 81.  Is since improved up to 11.6.  Notably her ferritin was 5.  Creatinine 0.8 on last check  She has a history of uterine leiomyoma.  She is status post cholecystectomy.  She is on iron supplementation at this time.   No family history of colon cancer or colon polyps  No upper GI symptoms.  Denies nausea vomiting abdominal pain dysphagia odynophagia weight loss or change in appetite.  She reports a daily bowel movement without melena hematochezia diarrhea or constipation  Past Medical History:  Diagnosis Date   Hypertension     Past Surgical History:  Procedure Laterality Date   CHOLECYSTECTOMY     THYROID SURGERY     THYROIDECTOMY      Family History No h/o GI disease or malignancy  Review of Systems  Constitutional:  Negative for activity change, appetite change, chills, diaphoresis, fatigue, fever and unexpected weight change.  HENT:  Negative for trouble swallowing and voice change.   Respiratory:  Negative for shortness of breath and wheezing.   Cardiovascular:  Negative for chest pain, palpitations and leg swelling.  Gastrointestinal:  Negative for abdominal distention, abdominal pain, anal bleeding, blood in stool, constipation, diarrhea, nausea, rectal pain and vomiting.  Genitourinary:  Positive for vaginal bleeding.  Musculoskeletal:  Negative for arthralgias and myalgias.  Skin:  Negative for color change and pallor.  Neurological:  Negative for dizziness, syncope and weakness.   Psychiatric/Behavioral:  Negative for confusion.   All other systems reviewed and are negative.    Medications No current facility-administered medications on file prior to encounter.   Current Outpatient Medications on File Prior to Encounter  Medication Sig Dispense Refill   cyclobenzaprine (FLEXERIL) 10 MG tablet Take 1 tablet (10 mg total) by mouth 3 (three) times daily as needed for muscle spasms. 30 tablet 0   doxylamine, Sleep, (UNISOM) 25 MG tablet Take 25 mg by mouth at bedtime as needed for sleep.     ibuprofen (ADVIL,MOTRIN) 600 MG tablet Take 1 tablet (600 mg total) by mouth every 8 (eight) hours as needed. 30 tablet 0   levothyroxine (SYNTHROID, LEVOTHROID) 125 MCG tablet Take 125 mcg by mouth daily.     cyclobenzaprine (FLEXERIL) 10 MG tablet Take 1 tablet (10 mg total) by mouth 3 (three) times daily as needed for muscle spasms. (Patient not taking: Reported on 08/06/2017) 12 tablet 0   hydrochlorothiazide (MICROZIDE) 12.5 MG capsule Take 12.5 mg by mouth daily.     HYDROcodone-acetaminophen (NORCO) 5-325 MG tablet Take 1 tablet by mouth every 4 (four) hours as needed for moderate pain. (Patient not taking: Reported on 08/06/2017) 6 tablet 0    Pertinent medications related to GI and procedure were reviewed by me with the patient prior to the procedure   Current Facility-Administered Medications:    0.9 %  sodium chloride infusion, , Intravenous, Continuous, Annamaria Helling, DO, Last Rate: 20 mL/hr at 02/11/23 0849, Restarted at 02/11/23 [id number]  Allergies  Allergen Reactions   Erythromycin Other (See Comments)    Unknown reaction   Allergies were reviewed by me prior to the procedure  Objective   Body mass index is 43.8 kg/m. Vitals:   02/11/23 0833  BP: (!) 135/99  Resp: 20  Temp: (!) 96.7 F (35.9 C)  TempSrc: Temporal  SpO2: 100%  Weight: 115.8 kg  Height: 5\' 4"  (1.626 m)      Physical Exam Vitals and nursing note reviewed.  Constitutional:       General: She is not in acute distress.    Appearance: Normal appearance. She is not ill-appearing, toxic-appearing or diaphoretic.  HENT:     Head: Normocephalic and atraumatic.     Nose: Nose normal.     Mouth/Throat:     Mouth: Mucous membranes are moist.     Pharynx: Oropharynx is clear.  Eyes:     General: No scleral icterus.    Extraocular Movements: Extraocular movements intact.  Cardiovascular:     Rate and Rhythm: Normal rate and regular rhythm.     Heart sounds: Normal heart sounds. No murmur heard.    No friction rub. No gallop.  Pulmonary:     Effort: Pulmonary effort is normal. No respiratory distress.     Breath sounds: Normal breath sounds. No wheezing, rhonchi or rales.  Abdominal:     General: Abdomen is flat. Bowel sounds are normal. There is no distension.     Palpations: Abdomen is soft.     Tenderness: There is no abdominal tenderness. There is no guarding or rebound.  Musculoskeletal:     Cervical back: Neck supple.     Right lower leg: No edema.     Left lower leg: No edema.  Skin:    General: Skin is warm and dry.     Coloration: Skin is not jaundiced or pale.  Neurological:     General: No focal deficit present.     Mental Status: She is alert and oriented to person, place, and time. Mental status is at baseline.  Psychiatric:        Mood and Affect: Mood normal.        Behavior: Behavior normal.        Thought Content: Thought content normal.        Judgment: Judgment normal.      Assessment:  Brenda Chapman is a 52 y.o. female  who presents today for Colonoscopy for Anemia .  Plan:  Colonoscopy with possible intervention today  Colonoscopy with possible biopsy, control of bleeding, polypectomy, and interventions as necessary has been discussed with the patient/patient representative. Informed consent was obtained from the patient/patient representative after explaining the indication, nature, and risks of the procedure including but  not limited to death, bleeding, perforation, missed neoplasm/lesions, cardiorespiratory compromise, and reaction to medications. Opportunity for questions was given and appropriate answers were provided. Patient/patient representative has verbalized understanding is amenable to undergoing the procedure.   Annamaria Helling, DO  [facility] Gastroenterology  Portions of the record may have been created with voice recognition software. Occasional wrong-word or 'sound-a-like' substitutions may have occurred due to the inherent limitations of voice recognition software.  Read the chart carefully and recognize, using context, where substitutions may have occurred.

## 2023-02-11 ENCOUNTER — Ambulatory Visit
Admission: RE | Admit: 2023-02-11 | Discharge: 2023-02-11 | Disposition: A | Discharge status: Home / Self Care | Payer: 59 | Attending: Gastroenterology | Admitting: Gastroenterology

## 2023-02-11 ENCOUNTER — Ambulatory Visit: Payer: 59 | Admitting: Certified Registered Nurse Anesthetist

## 2023-02-11 ENCOUNTER — Encounter
Admission: RE | Disposition: A | Discharge status: Home / Self Care | Payer: Self-pay | Source: Home / Self Care | Attending: Gastroenterology

## 2023-02-11 ENCOUNTER — Encounter: Payer: Self-pay | Admitting: Gastroenterology

## 2023-02-11 DIAGNOSIS — Z87891 Personal history of nicotine dependence: Secondary | ICD-10-CM | POA: Insufficient documentation

## 2023-02-11 DIAGNOSIS — K644 Residual hemorrhoidal skin tags: Secondary | ICD-10-CM | POA: Diagnosis not present

## 2023-02-11 DIAGNOSIS — D125 Benign neoplasm of sigmoid colon: Secondary | ICD-10-CM | POA: Diagnosis not present

## 2023-02-11 DIAGNOSIS — K5730 Diverticulosis of large intestine without perforation or abscess without bleeding: Secondary | ICD-10-CM | POA: Insufficient documentation

## 2023-02-11 DIAGNOSIS — Z6841 Body Mass Index (BMI) 40.0 and over, adult: Secondary | ICD-10-CM | POA: Insufficient documentation

## 2023-02-11 DIAGNOSIS — D509 Iron deficiency anemia, unspecified: Secondary | ICD-10-CM | POA: Insufficient documentation

## 2023-02-11 DIAGNOSIS — I10 Essential (primary) hypertension: Secondary | ICD-10-CM | POA: Insufficient documentation

## 2023-02-11 HISTORY — PX: COLONOSCOPY WITH PROPOFOL: SHX5780

## 2023-02-11 LAB — POCT PREGNANCY, URINE: Preg Test, Ur: NEGATIVE

## 2023-02-11 LAB — GLUCOSE, CAPILLARY: Glucose-Capillary: 85 mg/dL (ref 70–99)

## 2023-02-11 SURGERY — COLONOSCOPY WITH PROPOFOL
Anesthesia: General

## 2023-02-11 MED ORDER — PROPOFOL 500 MG/50ML IV EMUL
INTRAVENOUS | Status: DC | PRN
  Administered 2023-02-11: 150 ug/kg/min via INTRAVENOUS

## 2023-02-11 MED ORDER — SODIUM CHLORIDE 0.9 % IV SOLN
INTRAVENOUS | Status: DC
  Administered 2023-02-11: 20 mL/h via INTRAVENOUS

## 2023-02-11 MED ORDER — PROPOFOL 10 MG/ML IV BOLUS
INTRAVENOUS | Status: DC | PRN
  Administered 2023-02-11: 80 mg via INTRAVENOUS

## 2023-02-11 MED ORDER — LIDOCAINE HCL (CARDIAC) PF 100 MG/5ML IV SOSY
PREFILLED_SYRINGE | INTRAVENOUS | Status: DC | PRN
  Administered 2023-02-11: 50 mg via INTRAVENOUS

## 2023-02-11 NOTE — Anesthesia Preprocedure Evaluation (Signed)
Anesthesia Evaluation  Patient identified by MRN, date of birth, ID band Patient awake    Reviewed: Allergy & Precautions, NPO status , Patient's Chart, lab work & pertinent test results  History of Anesthesia Complications Negative for: history of anesthetic complications  Airway Mallampati: II  TM Distance: >3 FB Neck ROM: Full    Dental  (+) Teeth Intact   Pulmonary neg pulmonary ROS, neg sleep apnea, neg COPD, Patient abstained from smoking.Not current smoker, former smoker   Pulmonary exam normal breath sounds clear to auscultation       Cardiovascular Exercise Tolerance: Good METShypertension, (-) CAD and (-) Past MI (-) dysrhythmias  Rhythm:Regular Rate:Normal - Systolic murmurs    Neuro/Psych negative neurological ROS  negative psych ROS   GI/Hepatic ,neg GERD  ,,(+)     (-) substance abuse    Endo/Other  neg diabetes  Morbid obesity  Renal/GU negative Renal ROS     Musculoskeletal   Abdominal  (+) + obese  Peds  Hematology   Anesthesia Other Findings Past Medical History: No date: Hypertension  Reproductive/Obstetrics                              Anesthesia Physical Anesthesia Plan  ASA: 3  Anesthesia Plan: General   Post-op Pain Management: Minimal or no pain anticipated   Induction: Intravenous  PONV Risk Score and Plan: 3 and Propofol infusion, TIVA and Ondansetron  Airway Management Planned: Nasal Cannula  Additional Equipment: None  Intra-op Plan:   Post-operative Plan:   Informed Consent: I have reviewed the patients History and Physical, chart, labs and discussed the procedure including the risks, benefits and alternatives for the proposed anesthesia with the patient or authorized representative who has indicated his/her understanding and acceptance.     Dental advisory given  Plan Discussed with: CRNA and Surgeon  Anesthesia Plan Comments:  (Discussed risks of anesthesia with patient, including possibility of difficulty with spontaneous ventilation under anesthesia necessitating airway intervention, PONV, and rare risks such as cardiac or respiratory or neurological events, and allergic reactions. Discussed the role of CRNA in patient's perioperative care. Patient understands. Patient informed about increased incidence of above perioperative risk due to high BMI. Patient understands.  )         Anesthesia Quick Evaluation

## 2023-02-11 NOTE — Anesthesia Procedure Notes (Signed)
Date/Time: 02/11/2023 9:11 AM  Performed by: Johnna Acosta, CRNAPre-anesthesia Checklist: Patient identified, Emergency Drugs available, Suction available, Patient being monitored and Timeout performed Patient Re-evaluated:Patient Re-evaluated prior to induction Oxygen Delivery Method: Nasal cannula Preoxygenation: Pre-oxygenation with 100% oxygen Induction Type: IV induction

## 2023-02-11 NOTE — Op Note (Addendum)
[facility] Gastroenterology Patient Name: Brenda Chapman Procedure Date: 02/11/2023 9:01 AM MRN: [id number] Account #: [id number] Date of Birth: 03/29/1971 Admit Type: Outpatient Age: 52 Room: [facility] ENDO ROOM 3 Gender: Female Note Status: Supervisor Override Instrument Name: Jasper Riling [id number] Procedure:             Colonoscopy Indications:           Screening for colorectal malignant neoplasm Providers:             Annamaria Helling DO, DO Referring MD:          Rusty Aus, MD (Referring MD) Medicines:             Monitored Anesthesia Care Complications:         No immediate complications. Estimated blood loss:                         Minimal. Procedure:             Pre-Anesthesia Assessment:                        - Prior to the procedure, a History and Physical was                         performed, and patient medications and allergies were                         reviewed. The patient is competent. The risks and                         benefits of the procedure and the sedation options and                         risks were discussed with the patient. All questions                         were answered and informed consent was obtained.                         Patient identification and proposed procedure were                         verified by the physician, the nurse, the anesthetist                         and the technician in the endoscopy suite. Mental                         Status Examination: alert and oriented. Airway                         Examination: normal oropharyngeal airway and neck                         mobility. Respiratory Examination: clear to                         auscultation. CV Examination: RRR, no murmurs, no S3  or S4. Prophylactic Antibiotics: The patient does not                         require prophylactic antibiotics. Prior                         Anticoagulants: The patient has taken no  anticoagulant                         or antiplatelet agents. ASA Grade Assessment: II - A                         patient with mild systemic disease. After reviewing                         the risks and benefits, the patient was deemed in                         satisfactory condition to undergo the procedure. The                         anesthesia plan was to use monitored anesthesia care                         (MAC). Immediately prior to administration of                         medications, the patient was re-assessed for adequacy                         to receive sedatives. The heart rate, respiratory                         rate, oxygen saturations, blood pressure, adequacy of                         pulmonary ventilation, and response to care were                         monitored throughout the procedure. The physical                         status of the patient was re-assessed after the                         procedure.                        After obtaining informed consent, the colonoscope was                         passed under direct vision. Throughout the procedure,                         the patient's blood pressure, pulse, and oxygen                         saturations were monitored continuously. The  Colonoscope was introduced through the anus and                         advanced to the the terminal ileum, with                         identification of the appendiceal orifice and IC                         valve. The colonoscopy was performed without                         difficulty. The patient tolerated the procedure well.                         The quality of the bowel preparation was evaluated                         using the BBPS [facility] Bowel Preparation Scale) with                         scores of: Right Colon = 3, Transverse Colon = 3 and                         Left Colon = 3 (entire mucosa seen well with no                          residual staining, small fragments of stool or opaque                         liquid). The total BBPS score equals 9. The terminal                         ileum, ileocecal valve, appendiceal orifice, and                         rectum were photographed. Findings:      Skin tags were found on perianal exam.      The digital rectal exam was normal. Pertinent negatives include normal       sphincter tone.      The terminal ileum appeared normal. Estimated blood loss: none.      Retroflexion in the right colon was performed.      Multiple small-mouthed diverticula were found in the entire colon.       Estimated blood loss: none.      A 1 mm polyp was found in the sigmoid colon. The polyp was sessile. The       polyp was removed with a jumbo cold forceps. Resection and retrieval       were complete. Estimated blood loss was minimal.      Anal papilla(e) were hypertrophied. Estimated blood loss: none.      The exam was otherwise without abnormality on direct and retroflexion       views. Impression:            - Perianal skin tags found on perianal exam.                        -  The examined portion of the ileum was normal.                        - Diverticulosis in the entire examined colon.                        - One 1 mm polyp in the sigmoid colon, removed with a                         jumbo cold forceps. Resected and retrieved.                        - Anal papilla(e) were hypertrophied.                        - The examination was otherwise normal on direct and                         retroflexion views. Recommendation:        - Patient has a contact number available for                         emergencies. The signs and symptoms of potential                         delayed complications were discussed with the patient.                         Return to normal activities tomorrow. Written                         discharge instructions were provided to the patient.                         - Discharge patient to home.                        - Resume previous diet.                        - Continue present medications.                        - Await pathology results.                        - Repeat colonoscopy for surveillance based on                         pathology results.                        - Return to referring physician as previously                         scheduled.                        - The findings and recommendations were discussed with  the patient. Procedure Code(s):     --- Professional ---                        [phone], Colonoscopy, flexible; with biopsy, single or                         multiple Diagnosis Code(s):     --- Professional ---                        D12.5, Benign neoplasm of sigmoid colon                        K62.89, Other specified diseases of anus and rectum                        K64.4, Residual hemorrhoidal skin tags                        D50.9, Iron deficiency anemia, unspecified                        K57.30, Diverticulosis of large intestine without                         perforation or abscess without bleeding CPT copyright 2022 American Medical Association. All rights reserved. The codes documented in this report are preliminary and upon coder review may  be revised to meet current compliance requirements. Attending Participation:      I personally performed the entire procedure. Volney American, DO Annamaria Helling DO, DO 02/11/2023 9:38:19 AM This report has been signed electronically. Number of Addenda: 0 Note Initiated On: 02/11/2023 9:01 AM Scope Withdrawal Time: 0 hours 14 minutes 5 seconds  Total Procedure Duration: 0 hours 17 minutes 18 seconds  Estimated Blood Loss:  Estimated blood loss was minimal.      [facility]

## 2023-02-11 NOTE — Anesthesia Postprocedure Evaluation (Signed)
Anesthesia Post Note  Patient: Brenda Chapman  Procedure(s) Performed: COLONOSCOPY WITH PROPOFOL  Patient location during evaluation: Endoscopy Anesthesia Type: General Level of consciousness: awake and alert Pain management: pain level controlled Vital Signs Assessment: post-procedure vital signs reviewed and stable Respiratory status: spontaneous breathing, nonlabored ventilation, respiratory function stable and patient connected to nasal cannula oxygen Cardiovascular status: blood pressure returned to baseline and stable Postop Assessment: no apparent nausea or vomiting Anesthetic complications: no   No notable events documented.   Last Vitals:  Vitals:   02/11/23 0939 02/11/23 0955  BP: 111/67 (!) 124/90  Pulse:    Resp:    Temp:  (!) 35.8 C  SpO2:      Last Pain:  Vitals:   02/11/23 0955  TempSrc: Temporal  PainSc:                  Arita Miss

## 2023-02-11 NOTE — Interval H&P Note (Signed)
History and Physical Interval Note: Preprocedure H&P from 02/11/23  was reviewed and there was no interval change after seeing and examining the patient.  Written consent was obtained from the patient after discussion of risks, benefits, and alternatives. Patient has consented to proceed with Colonoscopy with possible intervention   02/11/2023 9:04 AM  Brenda Chapman  has presented today for surgery, with the diagnosis of Colon Cancer Screening.  The various methods of treatment have been discussed with the patient and family. After consideration of risks, benefits and other options for treatment, the patient has consented to  Procedure(s): COLONOSCOPY WITH PROPOFOL (N/A) as a surgical intervention.  The patient's history has been reviewed, patient examined, no change in status, stable for surgery.  I have reviewed the patient's chart and labs.  Questions were answered to the patient's satisfaction.     Annamaria Helling

## 2023-02-11 NOTE — Transfer of Care (Signed)
Immediate Anesthesia Transfer of Care Note  Patient: Brenda Chapman  Procedure(s) Performed: COLONOSCOPY WITH PROPOFOL  Patient Location: Endoscopy Unit  Anesthesia Type:General  Level of Consciousness: awake and drowsy  Airway & Oxygen Therapy: Patient Spontanous Breathing  Post-op Assessment: Report given to RN and Post -op Vital signs reviewed and stable  Post vital signs: Reviewed and stable  Last Vitals:  Vitals Value Taken Time  BP 103/63 02/11/23 0935  Temp    Pulse 81 02/11/23 0936  Resp 19 02/11/23 0936  SpO2 99 % 02/11/23 0936  Vitals shown include unvalidated device data.  Last Pain:  Vitals:   02/11/23 0934  TempSrc:   PainSc: 0-No pain         Complications: No notable events documented.

## 2023-02-14 ENCOUNTER — Encounter: Payer: Self-pay | Admitting: Gastroenterology

## 2023-02-14 LAB — SURGICAL PATHOLOGY

## 2023-08-18 ENCOUNTER — Other Ambulatory Visit: Payer: Self-pay | Admitting: Internal Medicine

## 2023-08-18 DIAGNOSIS — Z1231 Encounter for screening mammogram for malignant neoplasm of breast: Secondary | ICD-10-CM

## 2023-09-06 ENCOUNTER — Ambulatory Visit
Admission: RE | Admit: 2023-09-06 | Discharge: 2023-09-06 | Disposition: A | Discharge status: Home / Self Care | Payer: 59 | Source: Ambulatory Visit | Attending: Internal Medicine | Admitting: Internal Medicine

## 2023-09-06 DIAGNOSIS — Z1231 Encounter for screening mammogram for malignant neoplasm of breast: Secondary | ICD-10-CM | POA: Diagnosis present

## 2024-10-09 ENCOUNTER — Other Ambulatory Visit: Payer: Self-pay | Admitting: Internal Medicine

## 2024-10-09 DIAGNOSIS — Z1231 Encounter for screening mammogram for malignant neoplasm of breast: Secondary | ICD-10-CM

## 2024-10-16 ENCOUNTER — Ambulatory Visit
Admission: RE | Admit: 2024-10-16 | Discharge: 2024-10-16 | Disposition: A | Discharge status: Home / Self Care | Source: Ambulatory Visit | Attending: Internal Medicine | Admitting: Internal Medicine

## 2024-10-16 DIAGNOSIS — Z1231 Encounter for screening mammogram for malignant neoplasm of breast: Secondary | ICD-10-CM | POA: Diagnosis present
